# Patient Record
Sex: Male | Born: 1955 | Race: White | Hispanic: No | Marital: Married | Smoking: Former smoker
Health system: Southern US, Community
[De-identification: ages and names within clinical notes are randomized; demographics above are authoritative.]

## PROBLEM LIST (undated history)

## (undated) DIAGNOSIS — IMO0001 Reserved for inherently not codable concepts without codable children: Secondary | ICD-10-CM

## (undated) DIAGNOSIS — K219 Gastro-esophageal reflux disease without esophagitis: Secondary | ICD-10-CM

## (undated) DIAGNOSIS — I10 Essential (primary) hypertension: Secondary | ICD-10-CM

## (undated) DIAGNOSIS — E785 Hyperlipidemia, unspecified: Secondary | ICD-10-CM

## (undated) DIAGNOSIS — E119 Type 2 diabetes mellitus without complications: Secondary | ICD-10-CM

## (undated) DIAGNOSIS — M1990 Unspecified osteoarthritis, unspecified site: Secondary | ICD-10-CM

## (undated) DIAGNOSIS — J45909 Unspecified asthma, uncomplicated: Secondary | ICD-10-CM

## (undated) HISTORY — PX: CHOLECYSTECTOMY: SHX55

---

## 2014-04-05 ENCOUNTER — Emergency Department (INDEPENDENT_AMBULATORY_CARE_PROVIDER_SITE_OTHER): Payer: Self-pay

## 2014-04-05 ENCOUNTER — Encounter: Payer: Self-pay | Admitting: Emergency Medicine

## 2014-04-05 ENCOUNTER — Emergency Department
Admission: EM | Admit: 2014-04-05 | Discharge: 2014-04-05 | Disposition: A | Discharge status: Home / Self Care | Payer: Self-pay | Source: Home / Self Care | Attending: Emergency Medicine | Admitting: Emergency Medicine

## 2014-04-05 DIAGNOSIS — R0789 Other chest pain: Secondary | ICD-10-CM

## 2014-04-05 DIAGNOSIS — R05 Cough: Secondary | ICD-10-CM

## 2014-04-05 DIAGNOSIS — R6883 Chills (without fever): Secondary | ICD-10-CM

## 2014-04-05 DIAGNOSIS — R079 Chest pain, unspecified: Secondary | ICD-10-CM

## 2014-04-05 HISTORY — DX: Hyperlipidemia, unspecified: E78.5

## 2014-04-05 HISTORY — DX: Unspecified asthma, uncomplicated: J45.909

## 2014-04-05 HISTORY — DX: Gastro-esophageal reflux disease without esophagitis: K21.9

## 2014-04-05 HISTORY — DX: Type 2 diabetes mellitus without complications: E11.9

## 2014-04-05 HISTORY — DX: Essential (primary) hypertension: I10

## 2014-04-05 HISTORY — DX: Unspecified osteoarthritis, unspecified site: M19.90

## 2014-04-05 HISTORY — DX: Reserved for inherently not codable concepts without codable children: IMO0001

## 2014-04-05 MED ORDER — PREDNISONE 10 MG PO TABS: ORAL_TABLET | ORAL | Status: AC

## 2014-04-05 NOTE — Discharge Instructions (Signed)
Chest Wall Pain °Chest wall pain is pain in or around the bones and muscles of your chest. It may take up to 6 weeks to get better. It may take longer if you must stay physically active in your work and activities.  °CAUSES  °Chest wall pain may happen on its own. However, it may be caused by: °· A viral illness like the flu. °· Injury. °· Coughing. °· Exercise. °· Arthritis. °· Fibromyalgia. °· Shingles. °HOME CARE INSTRUCTIONS  °· Avoid overtiring physical activity. Try not to strain or perform activities that cause pain. This includes any activities using your chest or your abdominal and side muscles, especially if heavy weights are used. °· Put ice on the sore area. °¨ Put ice in a plastic bag. °¨ Place a towel between your skin and the bag. °¨ Leave the ice on for 15-20 minutes per hour while awake for the first 2 days. °· Only take over-the-counter or prescription medicines for pain, discomfort, or fever as directed by your caregiver. °SEEK IMMEDIATE MEDICAL CARE IF:  °· Your pain increases, or you are very uncomfortable. °· You have a fever. °· Your chest pain becomes worse. °· You have new, unexplained symptoms. °· You have nausea or vomiting. °· You feel sweaty or lightheaded. °· You have a cough with phlegm (sputum), or you cough up blood. °MAKE SURE YOU:  °· Understand these instructions. °· Will watch your condition. °· Will get help right away if you are not doing well or get worse. °Document Released: 05/28/2005 Document Revised: 08/20/2011 Document Reviewed: 01/22/2011 °[employer]® Patient Information ©2015 [employer]. This information is not intended to replace advice given to you by your health care provider. Make sure you discuss any questions you have with your health care provider. ° ° ° ° °Pleurisy °Pleurisy is an inflammation and swelling of the lining of the lungs (pleura). Because of this inflammation, it hurts to breathe. It can be aggravated by coughing, laughing, or deep breathing.  Pleurisy is often caused by an underlying infection or disease.  °HOME CARE INSTRUCTIONS  °Monitor your pleurisy for any changes. The following actions may help to alleviate any discomfort you are experiencing: °· Medicine may help with pain. Only take over-the-counter or prescription medicines for pain, discomfort, or fever as directed by your health care provider. °· Only take antibiotic medicine as directed. Make sure to finish it even if you start to feel better. °SEEK MEDICAL CARE IF:  °· Your pain is not controlled with medicine or is increasing. °· You have an increase in pus-like (purulent) secretions brought up with coughing. °SEEK IMMEDIATE MEDICAL CARE IF:  °· You have blue or dark lips, fingernails, or toenails. °· You are coughing up blood. °· You have increased difficulty breathing. °· You have continuing pain unrelieved by medicine or pain lasting more than 1 week. °· You have pain that radiates into your neck, arms, or jaw. °· You develop increased shortness of breath or wheezing. °· You develop a fever, rash, vomiting, fainting, or other serious symptoms. °MAKE SURE YOU: °· Understand these instructions.   °· Will watch your condition.   °· Will get help right away if you are not doing well or get worse. °  °Document Released: 05/28/2005 Document Revised: 01/28/2013 Document Reviewed: 11/09/2012 °[employer]® Patient Information ©2015 [employer]. This information is not intended to replace advice given to you by your health care provider. Make sure you discuss any questions you have with your health care provider. ° °

## 2014-04-05 NOTE — ED Notes (Signed)
Steven Walker c/o 1 week of chills, body aches, right CP/soreness and cough. Seen @ [facility] 6 days ago and given Augmentin. No improvement.

## 2014-04-05 NOTE — ED Provider Notes (Signed)
CSN: [id number]     Arrival date & time 04/05/14  1423 History   First MD Initiated Contact with Patient 04/05/14 1449     Chief Complaint  Patient presents with  . Generalized Body Aches  . Chills  . Chest Pain   (Consider location/radiation/quality/duration/timing/severity/associated sxs/prior Treatment) Patient is a 58 y.o. male presenting with cough. The history is provided by the patient. No language interpreter was used.  Cough Cough characteristics:  Non-productive and productive Sputum characteristics:  Nondescript Severity:  Moderate Onset quality:  Gradual Timing:  Constant Progression:  Worsening Chronicity:  New Smoker: no   Context: upper respiratory infection   Relieved by:  Nothing Worsened by:  Nothing tried Ineffective treatments:  None tried Associated symptoms: chest pain, shortness of breath and sore throat    Pt complains of pain in the right side of his chest.  Pt reports he was seen at [facility] and started  on Augmentin.  Pt is requesting chest xray.   Past Medical History  Diagnosis Date  . Hyperlipidemia   . Asthma   . Diabetes mellitus without complication   . Reflux   . Hypertension   . Arthritis    Past Surgical History  Procedure Laterality Date  . Cholecystectomy     Family History  Problem Relation Age of Onset  . Diabetes Mother   . Asthma Mother   . Diabetes Sister   . Diabetes Brother    History  Substance Use Topics  . Smoking status: Former Smoker    Types: Cigarettes  . Smokeless tobacco: Never Used  . Alcohol Use: No    Review of Systems  HENT: Positive for sore throat.   Respiratory: Positive for cough and shortness of breath.   Cardiovascular: Positive for chest pain.  All other systems reviewed and are negative.   Allergies  Review of patient's allergies indicates no known allergies.  Home Medications   Prior to Admission medications   Medication Sig Start Date End Date Taking? Authorizing Provider   albuterol (PROVENTIL) (2.5 MG/3ML) 0.083% nebulizer solution Take 2.5 mg by nebulization every 6 (six) hours as needed for wheezing or shortness of breath.   Yes Historical Provider, MD  budesonide-formoterol (SYMBICORT) 160-4.5 MCG/ACT inhaler Inhale 2 puffs into the lungs 2 (two) times daily.   Yes Historical Provider, MD  celecoxib (CELEBREX) 200 MG capsule Take 200 mg by mouth 2 (two) times daily.   Yes Historical Provider, MD  CETIRIZINE HCL PO Take by mouth.   Yes Historical Provider, MD  esomeprazole (NEXIUM) 40 MG capsule Take 40 mg by mouth daily at 12 noon.   Yes Historical Provider, MD  lisinopril (PRINIVIL,ZESTRIL) 20 MG tablet Take 20 mg by mouth daily.   Yes Historical Provider, MD  mometasone (NASONEX) 50 MCG/ACT nasal spray Place 2 sprays into the nose daily.   Yes Historical Provider, MD  sitaGLIPtin-metformin (JANUMET) 50-500 MG per tablet Take 1 tablet by mouth 2 (two) times daily with a meal.   Yes Historical Provider, MD  traMADol (ULTRAM) 50 MG tablet Take by mouth every 6 (six) hours as needed.   Yes Historical Provider, MD   BP 152/90  Pulse 95  Temp(Src) 98.2 F (36.8 C) (Oral)  Resp 16  Ht 5\' 9"  (1.753 m)  Wt 223 lb (101.152 kg)  BMI 32.92 kg/m2  SpO2 98% Physical Exam  Nursing note and vitals reviewed. Constitutional: He is oriented to person, place, and time. He appears well-developed and well-nourished.  HENT:  Head: Normocephalic and atraumatic.  Eyes: Conjunctivae and EOM are normal. Pupils are equal, round, and reactive to light.  Neck: Normal range of motion.  Cardiovascular: Normal rate.   Pulmonary/Chest: Effort normal. He exhibits tenderness.  Tender right chest,   Abdominal: Soft.  Musculoskeletal: Normal range of motion.  Neurological: He is alert and oriented to person, place, and time. He has normal reflexes.  Skin: Skin is warm.  Psychiatric: He has a normal mood and affect.    ED Course  Procedures (including critical care time) Labs  Review Labs Reviewed - No data to display  Imaging Review Dg Chest 2 View  04/05/2014   CLINICAL DATA:  Chills, body aches, right side chest pain and cough  EXAM: CHEST  2 VIEW  COMPARISON:  None.  FINDINGS: The heart size and mediastinal contours are within normal limits. Both lungs are clear. The visualized skeletal structures are unremarkable.  IMPRESSION: No active cardiopulmonary disease.   Electronically Signed   By: Elige KoHetal  Patel   On: 04/05/2014 15:42   EKG normal EKG.   MDM   1. Chest pain      Pt given prednisone taper Augmentin  Pt advised to return if any problems AVS  Elson AreasLeslie K Berline Semrad, PA-C 04/05/14 2003

## 2014-04-08 NOTE — ED Provider Notes (Signed)
Medical history/examination/treatment/procedure(s) were performed by non-physician provider and as supervising physician I was immediately available for consultation/collaboration.    Lajean Manesavid Massey, MD 04/08/14 1012

## 2015-09-12 IMAGING — CR DG CHEST 2V
2 series · 2 of 2 positions shown · non-contrast
Comparison: None.

CLINICAL DATA: Chills, body aches, right side chest pain and cough

EXAM:
CHEST  2 VIEW

[view not recorded (1 of 2)]
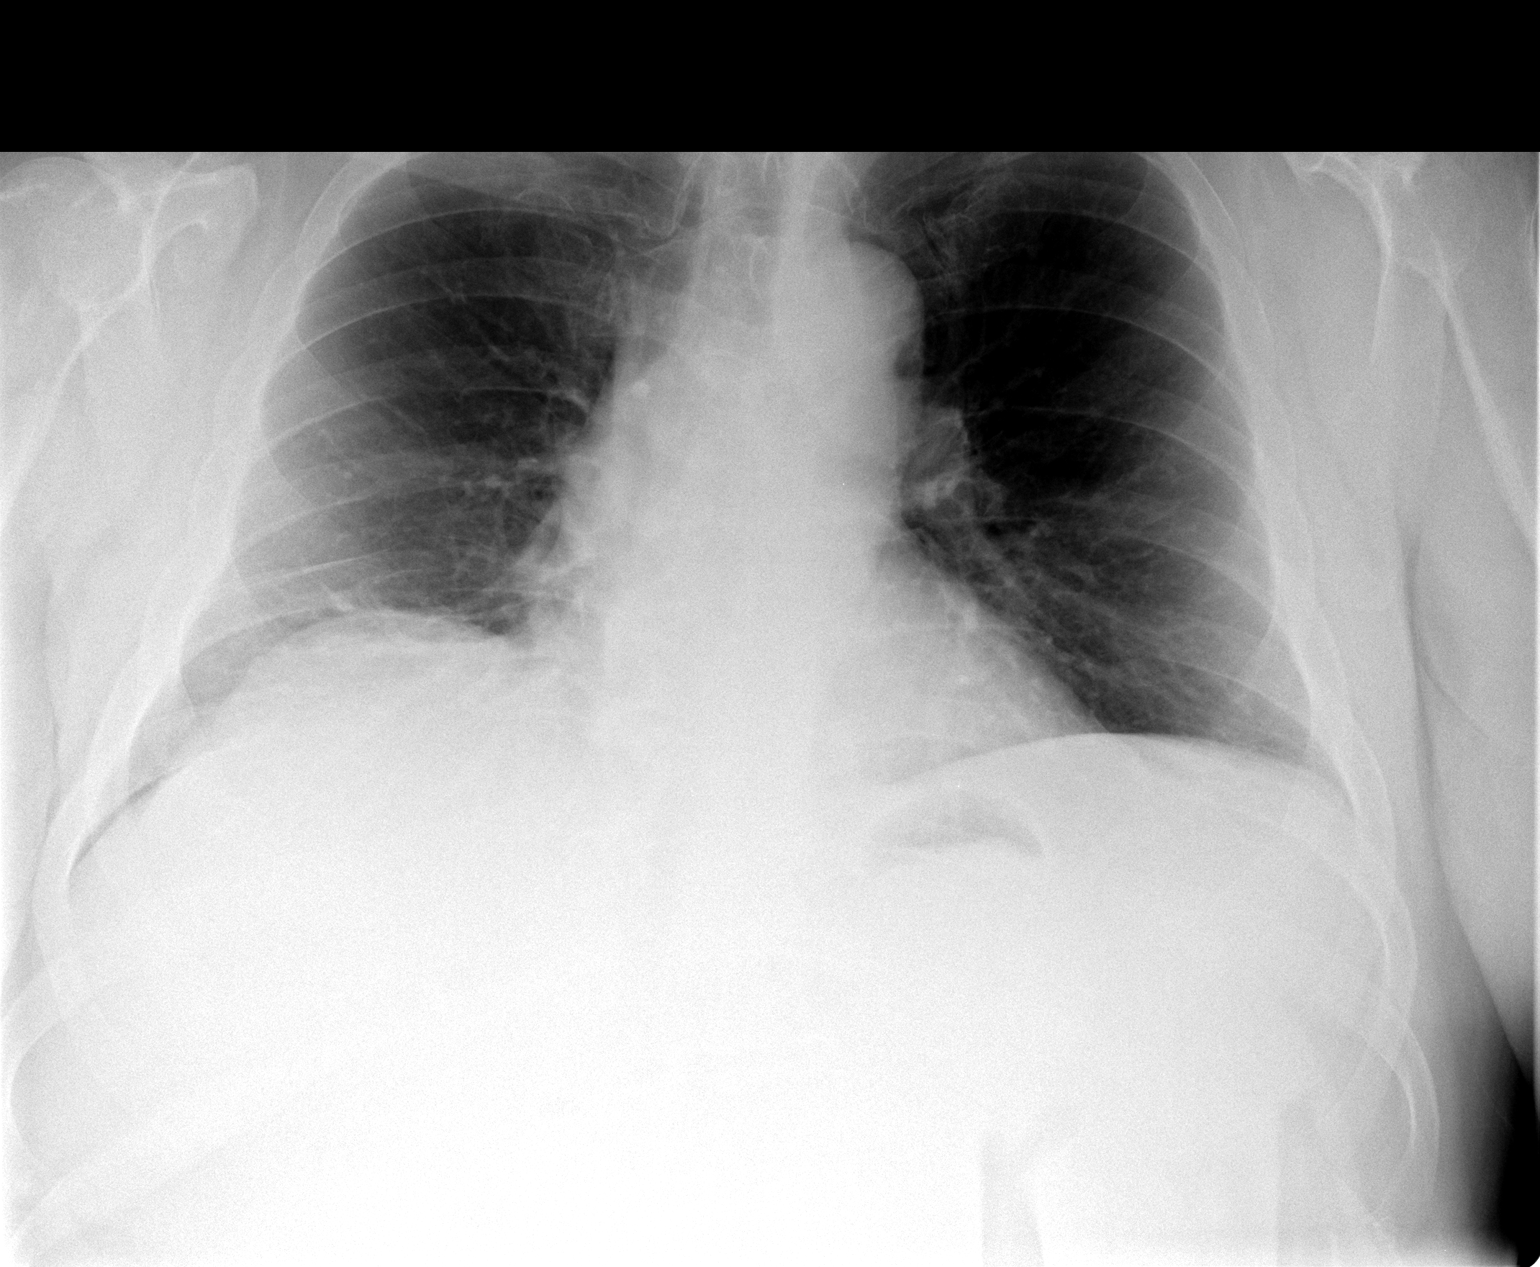

[view not recorded (2 of 2)]
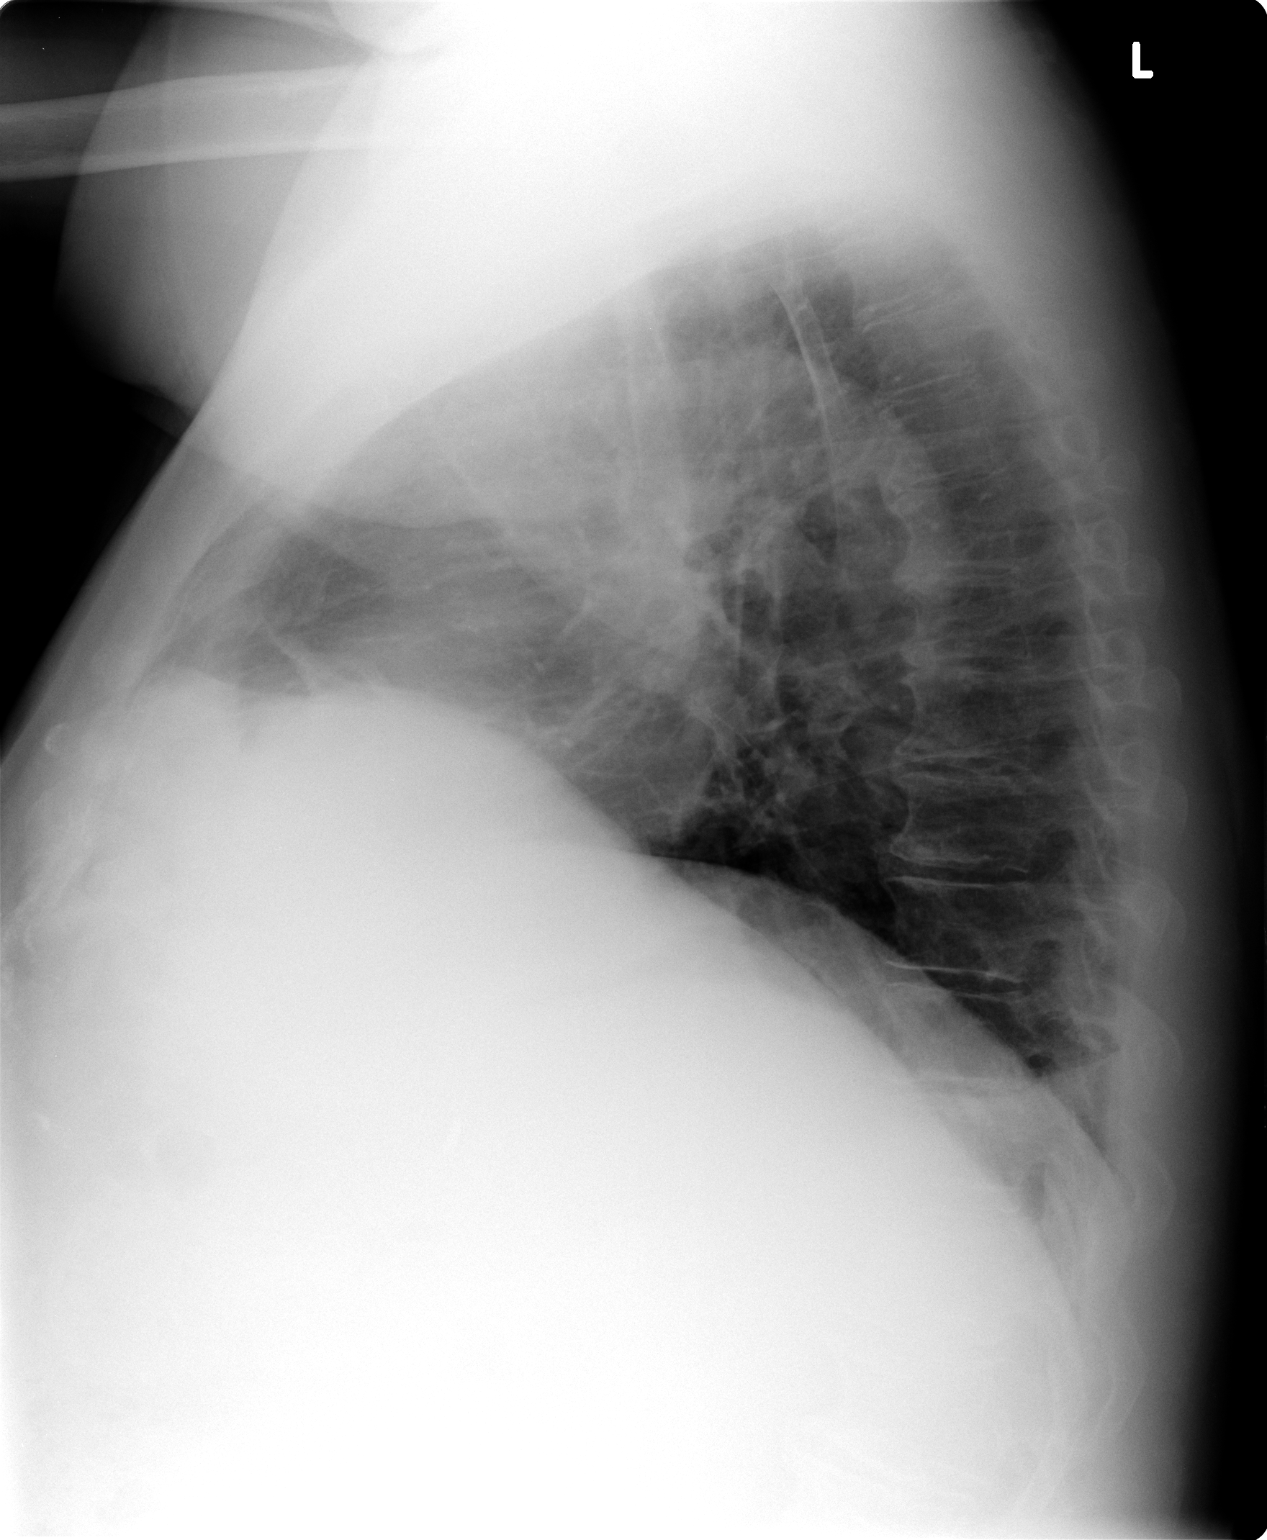

[2 of 2 positions shown; findings below may reference images not displayed]

FINDINGS: The heart size and mediastinal contours are within normal limits.
Both lungs are clear. The visualized skeletal structures are
unremarkable.
IMPRESSION: No active cardiopulmonary disease.

## 2022-05-11 DEATH — deceased
